# Patient Record
Sex: Male | Born: 1945 | Race: White | Hispanic: No | Marital: Single | Smoking: Never smoker
Health system: Southern US, Community
[De-identification: ages and names within clinical notes are randomized; demographics above are authoritative.]

## PROBLEM LIST (undated history)

## (undated) DIAGNOSIS — C801 Malignant (primary) neoplasm, unspecified: Secondary | ICD-10-CM

## (undated) DIAGNOSIS — E7800 Pure hypercholesterolemia, unspecified: Secondary | ICD-10-CM

## (undated) DIAGNOSIS — N189 Chronic kidney disease, unspecified: Secondary | ICD-10-CM

## (undated) DIAGNOSIS — I10 Essential (primary) hypertension: Secondary | ICD-10-CM

## (undated) DIAGNOSIS — M1990 Unspecified osteoarthritis, unspecified site: Secondary | ICD-10-CM

## (undated) DIAGNOSIS — Z905 Acquired absence of kidney: Secondary | ICD-10-CM

## (undated) HISTORY — DX: Chronic kidney disease, unspecified: N18.9

## (undated) HISTORY — DX: Malignant (primary) neoplasm, unspecified: C80.1

## (undated) HISTORY — PX: CORONARY ANGIOPLASTY WITH STENT PLACEMENT: SHX49

## (undated) HISTORY — PX: MOHS SURGERY: SHX181

## (undated) HISTORY — PX: APPENDECTOMY: SHX54

## (undated) HISTORY — DX: Unspecified osteoarthritis, unspecified site: M19.90

## (undated) HISTORY — DX: Acquired absence of kidney: Z90.5

## (undated) HISTORY — PX: NEPHRECTOMY: SHX65

---

## 2015-02-05 DIAGNOSIS — N400 Enlarged prostate without lower urinary tract symptoms: Secondary | ICD-10-CM | POA: Insufficient documentation

## 2016-09-29 DIAGNOSIS — E785 Hyperlipidemia, unspecified: Secondary | ICD-10-CM | POA: Insufficient documentation

## 2016-09-29 DIAGNOSIS — Z955 Presence of coronary angioplasty implant and graft: Secondary | ICD-10-CM | POA: Insufficient documentation

## 2016-09-29 DIAGNOSIS — I10 Essential (primary) hypertension: Secondary | ICD-10-CM | POA: Insufficient documentation

## 2017-04-16 DIAGNOSIS — Z85528 Personal history of other malignant neoplasm of kidney: Secondary | ICD-10-CM | POA: Insufficient documentation

## 2017-04-16 DIAGNOSIS — I2510 Atherosclerotic heart disease of native coronary artery without angina pectoris: Secondary | ICD-10-CM | POA: Insufficient documentation

## 2017-09-10 ENCOUNTER — Emergency Department (HOSPITAL_COMMUNITY)
Admission: EM | Admit: 2017-09-10 | Discharge: 2017-09-10 | Disposition: A | Discharge status: Home / Self Care | Payer: Medicare HMO | Attending: Emergency Medicine | Admitting: Emergency Medicine

## 2017-09-10 ENCOUNTER — Encounter (HOSPITAL_COMMUNITY): Payer: Self-pay | Admitting: Emergency Medicine

## 2017-09-10 DIAGNOSIS — B029 Zoster without complications: Secondary | ICD-10-CM

## 2017-09-10 DIAGNOSIS — I10 Essential (primary) hypertension: Secondary | ICD-10-CM | POA: Diagnosis not present

## 2017-09-10 DIAGNOSIS — Z79899 Other long term (current) drug therapy: Secondary | ICD-10-CM | POA: Insufficient documentation

## 2017-09-10 DIAGNOSIS — R21 Rash and other nonspecific skin eruption: Secondary | ICD-10-CM | POA: Diagnosis present

## 2017-09-10 HISTORY — DX: Essential (primary) hypertension: I10

## 2017-09-10 HISTORY — DX: Pure hypercholesterolemia, unspecified: E78.00

## 2017-09-10 MED ORDER — VALACYCLOVIR HCL 1 G PO TABS: 1000.0000 mg | ORAL_TABLET | Freq: Three times a day (TID) | ORAL | 0 refills | Status: AC

## 2017-09-10 NOTE — ED Triage Notes (Signed)
Pt c/o right rib pain.  Pt presents with redness & fluid filled vesicles to right back radiating to right rib area.  Pt stated "I saw my chiropractor and thought he had done something to me, so I went to sleep with the heating pad.  Look at how I burned myself."

## 2017-09-13 ENCOUNTER — Ambulatory Visit (HOSPITAL_COMMUNITY)
Admission: EM | Admit: 2017-09-13 | Discharge: 2017-09-13 | Disposition: A | Discharge status: Home / Self Care | Payer: Medicare HMO | Attending: Internal Medicine | Admitting: Internal Medicine

## 2017-09-13 ENCOUNTER — Encounter (HOSPITAL_COMMUNITY): Payer: Self-pay | Admitting: Emergency Medicine

## 2017-09-13 ENCOUNTER — Other Ambulatory Visit: Payer: Self-pay

## 2017-09-13 DIAGNOSIS — B029 Zoster without complications: Secondary | ICD-10-CM

## 2017-09-13 NOTE — ED Triage Notes (Signed)
States was Dx with shingles on Sunday at ER and is here to discuss contagious stages

## 2017-09-13 NOTE — Discharge Instructions (Signed)
Continue to take Valtrex.  He may keep area of rash covered with nonadherent gauze.  Please have good hand hygiene.  Please avoid exposure to anything that touches that area of your side.

## 2017-09-13 NOTE — ED Provider Notes (Signed)
Arthur Phillips    CSN: [id number] Arrival date & time: 09/13/17  1134     History   Chief Complaint Chief Complaint  Patient presents with  . Herpes Zoster    HPI Arthur Phillips is a 72 y.o. male history of hypertension and hyperlipidemia presenting today to discuss prevention of transmission of shingles.  Patient was seen in emergency room 2 days ago for a rash on his right side.  Patient was diagnosed with shingles, began on Valtrex.  Has been taking this for approximately 3 to 4 days.  Is concerned about spreading to his son who was recently in the hospital.  His son is 23 years old and is unsure if he had chickenpox as a child, but did not have the vaccine.  His son is not immunocompromised.  HPI  Past Medical History:  Diagnosis Date  . Hypercholesteremia   . Hypertension     There are no active problems to display for this patient.   Past Surgical History:  Procedure Laterality Date  . APPENDECTOMY    . CORONARY ANGIOPLASTY WITH STENT PLACEMENT    . MOHS SURGERY    . NEPHRECTOMY     right       Home Medications    Prior to Admission medications   Medication Sig Start Date End Date Taking? Authorizing Provider  atorvastatin (LIPITOR) 40 MG tablet 40 mg. 08/14/17   [provider]  metoprolol succinate (TOPROL-XL) 25 MG 24 hr tablet 25 mg. 08/14/17   [provider]  quinapril (ACCUPRIL) 10 MG tablet 10 mg. 08/14/17   [provider]  valACYclovir (VALTREX) 1000 MG tablet Take 1 tablet (1,000 mg total) by mouth 3 (three) times daily for 14 days. 09/10/17 09/24/17  Jola Schmidt, MD    Family History No family history on file.  Social History Social History   Tobacco Use  . Smoking status: Not on file  Substance Use Topics  . Alcohol use: Never    Frequency: Never  . Drug use: Never     Allergies   Patient has no known allergies.   Review of Systems Review of Systems  Constitutional: Negative for fatigue and fever.    Respiratory: Negative for shortness of breath.   Cardiovascular: Negative for chest pain.  Musculoskeletal: Positive for myalgias. Negative for arthralgias and neck pain.  Skin: Positive for color change and rash.  Neurological: Negative for dizziness, weakness, light-headedness, numbness and headaches.     Physical Exam Triage Vital Signs ED Triage Vitals  Enc Vitals Group     BP 09/13/17 1221 (!) 139/91     Pulse Rate 09/13/17 1221 79     Resp --      Temp 09/13/17 1221 (!) 97.3 F (36.3 C)     Temp Source 09/13/17 1221 Oral     SpO2 09/13/17 1221 95 %     Weight --      Height --      Head Circumference --      Peak Flow --      Pain Score 09/13/17 1218 0     Pain Loc --      Pain Edu? --      Excl. in [place]? --    No data found.  Updated Vital Signs BP (!) 139/91 (BP Location: Left Arm)   Pulse 79   Temp (!) 97.3 F (36.3 C) (Oral)   SpO2 95%   Visual Acuity Right Eye Distance:   Left  Eye Distance:   Bilateral Distance:    Right Eye Near:   Left Eye Near:    Bilateral Near:     Physical Exam  Constitutional: He appears well-developed and well-nourished.  HENT:  Head: Normocephalic and atraumatic.  Eyes: Conjunctivae are normal.  Neck: Neck supple.  Cardiovascular: Normal rate.  Pulmonary/Chest: Effort normal. No respiratory distress.  Musculoskeletal: He exhibits no edema.  Neurological: He is alert.  Skin: Skin is warm and dry. Rash noted.  Dermatomal rash to right\side and abdomen, multiple lesions still vesicular and have not began crusting.  Psychiatric: He has a normal mood and affect.  Nursing note and vitals reviewed.    UC Treatments / Results  Labs (all labs ordered are listed, but only abnormal results are displayed) Labs Reviewed - No data to display  EKG None Radiology No results found.  Procedures Procedures (including critical care time)  Medications Ordered in UC Medications - No data to display   Initial Impression /  Assessment and Plan / UC Course  I have reviewed the triage vital signs and the nursing notes.  Pertinent labs & imaging results that were available during my care of the patient were reviewed by me and considered in my medical decision making (see chart for details).     Discussed with patient about covering area to prevent transmission as it spread to contact.  Discussed separating laundry and having good hand hygiene.  It appears that his rash is still in a fascicular stage and likely still contagious.  Advised that until this is crusted begins healing that he will still be contagious. Discussed strict return precautions. Patient verbalized understanding and is agreeable with plan.   Final Clinical Impressions(s) / UC Diagnoses   Final diagnoses:  Herpes zoster without complication    ED Discharge Orders    None       Controlled Substance Prescriptions [place] Controlled Substance Registry consulted? Not Applicable   Arthur Phillips, [name], [name] 09/13/17 1329

## 2017-09-18 NOTE — ED Provider Notes (Signed)
[facility] DEPT Provider Note   CSN: [id number] Arrival date & time: 09/10/17  0148     History   Chief Complaint Chief Complaint  Patient presents with  . Rash    HPI Arthur Phillips is a 72 y.o. male.  HPI Patient is a 72 year old male who reports 3 days of right-sided rib discomfort.  He went saw his chiropractor for back pain and lateral chest pain and noticed a rash this evening.  He has a rash consistent with herpes zoster involving the right T7-T8 dermatome region.  No fevers or chills.  No redness.  No history of shingles before.  He has not received his shingles vaccine.  No other complaints at this time.   Past Medical History:  Diagnosis Date  . Hypercholesteremia   . Hypertension     There are no active problems to display for this patient.   Past Surgical History:  Procedure Laterality Date  . APPENDECTOMY    . CORONARY ANGIOPLASTY WITH STENT PLACEMENT    . MOHS SURGERY    . NEPHRECTOMY     right        Home Medications    Prior to Admission medications   Medication Sig Start Date End Date Taking? Authorizing Provider  atorvastatin (LIPITOR) 40 MG tablet 40 mg. 08/14/17   [provider]  metoprolol succinate (TOPROL-XL) 25 MG 24 hr tablet 25 mg. 08/14/17   [provider]  quinapril (ACCUPRIL) 10 MG tablet 10 mg. 08/14/17   [provider]  valACYclovir (VALTREX) 1000 MG tablet Take 1 tablet (1,000 mg total) by mouth 3 (three) times daily for 14 days. 09/10/17 09/24/17  Jola Schmidt, MD    Family History No family history on file.  Social History Social History   Tobacco Use  . Smoking status: Not on file  Substance Use Topics  . Alcohol use: Never    Frequency: Never  . Drug use: Never     Allergies   Patient has no known allergies.   Review of Systems Review of Systems  All other systems reviewed and are negative.    Physical Exam Updated Vital Signs BP (!) 158/93 (BP  Location: Right Arm)   Pulse 89   Temp (!) 97.4 F (36.3 C) (Oral)   Resp 20   Ht 5\' 10"  (1.778 m)   Wt 90.7 kg (200 lb)   SpO2 95%   BMI 28.70 kg/m   Physical Exam  Constitutional: He is oriented to person, place, and time. He appears well-developed and well-nourished.  HENT:  Head: Normocephalic.  Eyes: EOM are normal.  Neck: Normal range of motion.  Pulmonary/Chest: Effort normal.  Abdominal: He exhibits no distension.  Musculoskeletal: Normal range of motion.  Neurological: He is alert and oriented to person, place, and time.  Skin:  Right-sided rash consistent with herpes zoster involving the right T7-T8 dermatomes.  No surrounding signs of infection  Psychiatric: He has a normal mood and affect.  Nursing note and vitals reviewed.    ED Treatments / Results  Labs (all labs ordered are listed, but only abnormal results are displayed) Labs Reviewed - No data to display  EKG None  Radiology No results found.  Procedures Procedures (including critical care time)  Medications Ordered in ED Medications - No data to display   Initial Impression / Assessment and Plan / ED Course  I have reviewed the triage vital signs and the nursing notes.  Pertinent labs & imaging results  that were available during my care of the patient were reviewed by me and considered in my medical decision making (see chart for details).     Herpes zoster.  DC home with antiviral medication.  Primary care follow-up.  No signs of secondary infection at this time  Final Clinical Impressions(s) / ED Diagnoses   Final diagnoses:  Herpes zoster without complication    ED Discharge Orders        Ordered    valACYclovir (VALTREX) 1000 MG tablet  3 times daily     09/10/17 0301       Jola Schmidt, MD 09/18/17 [phone]

## 2017-09-28 DIAGNOSIS — M9903 Segmental and somatic dysfunction of lumbar region: Secondary | ICD-10-CM | POA: Diagnosis not present

## 2017-09-28 DIAGNOSIS — M9905 Segmental and somatic dysfunction of pelvic region: Secondary | ICD-10-CM | POA: Diagnosis not present

## 2017-09-28 DIAGNOSIS — M9902 Segmental and somatic dysfunction of thoracic region: Secondary | ICD-10-CM | POA: Diagnosis not present

## 2017-09-28 DIAGNOSIS — M4604 Spinal enthesopathy, thoracic region: Secondary | ICD-10-CM | POA: Diagnosis not present

## 2017-10-01 DIAGNOSIS — M9905 Segmental and somatic dysfunction of pelvic region: Secondary | ICD-10-CM | POA: Diagnosis not present

## 2017-10-01 DIAGNOSIS — M4604 Spinal enthesopathy, thoracic region: Secondary | ICD-10-CM | POA: Diagnosis not present

## 2017-10-01 DIAGNOSIS — M9903 Segmental and somatic dysfunction of lumbar region: Secondary | ICD-10-CM | POA: Diagnosis not present

## 2017-10-01 DIAGNOSIS — M9902 Segmental and somatic dysfunction of thoracic region: Secondary | ICD-10-CM | POA: Diagnosis not present

## 2017-10-04 DIAGNOSIS — M9902 Segmental and somatic dysfunction of thoracic region: Secondary | ICD-10-CM | POA: Diagnosis not present

## 2017-10-04 DIAGNOSIS — M4604 Spinal enthesopathy, thoracic region: Secondary | ICD-10-CM | POA: Diagnosis not present

## 2017-10-04 DIAGNOSIS — M9903 Segmental and somatic dysfunction of lumbar region: Secondary | ICD-10-CM | POA: Diagnosis not present

## 2017-10-04 DIAGNOSIS — M9905 Segmental and somatic dysfunction of pelvic region: Secondary | ICD-10-CM | POA: Diagnosis not present

## 2017-10-08 DIAGNOSIS — M4604 Spinal enthesopathy, thoracic region: Secondary | ICD-10-CM | POA: Diagnosis not present

## 2017-10-08 DIAGNOSIS — M9905 Segmental and somatic dysfunction of pelvic region: Secondary | ICD-10-CM | POA: Diagnosis not present

## 2017-10-08 DIAGNOSIS — M9902 Segmental and somatic dysfunction of thoracic region: Secondary | ICD-10-CM | POA: Diagnosis not present

## 2017-10-08 DIAGNOSIS — M9903 Segmental and somatic dysfunction of lumbar region: Secondary | ICD-10-CM | POA: Diagnosis not present

## 2017-10-18 DIAGNOSIS — M9902 Segmental and somatic dysfunction of thoracic region: Secondary | ICD-10-CM | POA: Diagnosis not present

## 2017-10-18 DIAGNOSIS — M9903 Segmental and somatic dysfunction of lumbar region: Secondary | ICD-10-CM | POA: Diagnosis not present

## 2017-10-18 DIAGNOSIS — M9905 Segmental and somatic dysfunction of pelvic region: Secondary | ICD-10-CM | POA: Diagnosis not present

## 2017-10-18 DIAGNOSIS — M4604 Spinal enthesopathy, thoracic region: Secondary | ICD-10-CM | POA: Diagnosis not present

## 2017-10-25 DIAGNOSIS — M9905 Segmental and somatic dysfunction of pelvic region: Secondary | ICD-10-CM | POA: Diagnosis not present

## 2017-10-25 DIAGNOSIS — M4604 Spinal enthesopathy, thoracic region: Secondary | ICD-10-CM | POA: Diagnosis not present

## 2017-10-25 DIAGNOSIS — M9902 Segmental and somatic dysfunction of thoracic region: Secondary | ICD-10-CM | POA: Diagnosis not present

## 2017-10-25 DIAGNOSIS — M9903 Segmental and somatic dysfunction of lumbar region: Secondary | ICD-10-CM | POA: Diagnosis not present

## 2017-11-01 DIAGNOSIS — M9905 Segmental and somatic dysfunction of pelvic region: Secondary | ICD-10-CM | POA: Diagnosis not present

## 2017-11-01 DIAGNOSIS — M9902 Segmental and somatic dysfunction of thoracic region: Secondary | ICD-10-CM | POA: Diagnosis not present

## 2017-11-01 DIAGNOSIS — M9903 Segmental and somatic dysfunction of lumbar region: Secondary | ICD-10-CM | POA: Diagnosis not present

## 2017-11-01 DIAGNOSIS — M4604 Spinal enthesopathy, thoracic region: Secondary | ICD-10-CM | POA: Diagnosis not present

## 2017-11-06 DIAGNOSIS — I10 Essential (primary) hypertension: Secondary | ICD-10-CM | POA: Diagnosis not present

## 2017-11-06 DIAGNOSIS — Z955 Presence of coronary angioplasty implant and graft: Secondary | ICD-10-CM | POA: Diagnosis not present

## 2017-11-06 DIAGNOSIS — I2510 Atherosclerotic heart disease of native coronary artery without angina pectoris: Secondary | ICD-10-CM | POA: Diagnosis not present

## 2017-11-06 DIAGNOSIS — E785 Hyperlipidemia, unspecified: Secondary | ICD-10-CM | POA: Diagnosis not present

## 2017-11-08 DIAGNOSIS — M9902 Segmental and somatic dysfunction of thoracic region: Secondary | ICD-10-CM | POA: Diagnosis not present

## 2017-11-08 DIAGNOSIS — M9905 Segmental and somatic dysfunction of pelvic region: Secondary | ICD-10-CM | POA: Diagnosis not present

## 2017-11-08 DIAGNOSIS — M4604 Spinal enthesopathy, thoracic region: Secondary | ICD-10-CM | POA: Diagnosis not present

## 2017-11-08 DIAGNOSIS — M9903 Segmental and somatic dysfunction of lumbar region: Secondary | ICD-10-CM | POA: Diagnosis not present

## 2017-11-15 DIAGNOSIS — M9902 Segmental and somatic dysfunction of thoracic region: Secondary | ICD-10-CM | POA: Diagnosis not present

## 2017-11-15 DIAGNOSIS — M9905 Segmental and somatic dysfunction of pelvic region: Secondary | ICD-10-CM | POA: Diagnosis not present

## 2017-11-15 DIAGNOSIS — M4604 Spinal enthesopathy, thoracic region: Secondary | ICD-10-CM | POA: Diagnosis not present

## 2017-11-15 DIAGNOSIS — M9903 Segmental and somatic dysfunction of lumbar region: Secondary | ICD-10-CM | POA: Diagnosis not present

## 2018-04-25 DIAGNOSIS — L905 Scar conditions and fibrosis of skin: Secondary | ICD-10-CM | POA: Diagnosis not present

## 2018-04-25 DIAGNOSIS — D225 Melanocytic nevi of trunk: Secondary | ICD-10-CM | POA: Diagnosis not present

## 2018-04-25 DIAGNOSIS — D1801 Hemangioma of skin and subcutaneous tissue: Secondary | ICD-10-CM | POA: Diagnosis not present

## 2018-04-25 DIAGNOSIS — D239 Other benign neoplasm of skin, unspecified: Secondary | ICD-10-CM | POA: Diagnosis not present

## 2018-04-25 DIAGNOSIS — L821 Other seborrheic keratosis: Secondary | ICD-10-CM | POA: Diagnosis not present

## 2018-05-03 DIAGNOSIS — R351 Nocturia: Secondary | ICD-10-CM | POA: Diagnosis not present

## 2018-05-03 DIAGNOSIS — N401 Enlarged prostate with lower urinary tract symptoms: Secondary | ICD-10-CM | POA: Diagnosis not present

## 2018-07-04 ENCOUNTER — Encounter: Payer: Self-pay | Admitting: Gastroenterology

## 2018-07-18 ENCOUNTER — Ambulatory Visit (AMBULATORY_SURGERY_CENTER): Payer: Self-pay

## 2018-07-18 ENCOUNTER — Encounter: Payer: Self-pay | Admitting: Gastroenterology

## 2018-07-18 VITALS — Ht 70.0 in | Wt 207.8 lb

## 2018-07-18 DIAGNOSIS — R195 Other fecal abnormalities: Secondary | ICD-10-CM

## 2018-07-18 MED ORDER — PEG 3350-KCL-NA BICARB-NACL 420 G PO SOLR: 4000.0000 mL | Freq: Once | ORAL | 0 refills | Status: AC

## 2018-07-18 NOTE — Progress Notes (Signed)
Denies allergies to eggs or soy products. Denies complication of anesthesia or sedation. Denies use of weight loss medication. Denies use of O2.   Emmi instructions declined.  

## 2018-08-01 ENCOUNTER — Encounter: Payer: Medicare HMO | Admitting: Gastroenterology

## 2018-10-22 DIAGNOSIS — M1712 Unilateral primary osteoarthritis, left knee: Secondary | ICD-10-CM | POA: Insufficient documentation

## 2019-04-25 ENCOUNTER — Ambulatory Visit: Payer: Medicare Other | Admitting: Podiatry

## 2019-04-25 ENCOUNTER — Ambulatory Visit: Payer: Medicare Other

## 2019-04-25 ENCOUNTER — Other Ambulatory Visit: Payer: Self-pay

## 2019-04-25 ENCOUNTER — Encounter: Payer: Self-pay | Admitting: Podiatry

## 2019-04-25 DIAGNOSIS — G8929 Other chronic pain: Secondary | ICD-10-CM | POA: Diagnosis not present

## 2019-04-25 DIAGNOSIS — M79675 Pain in left toe(s): Secondary | ICD-10-CM

## 2019-04-25 DIAGNOSIS — L600 Ingrowing nail: Secondary | ICD-10-CM | POA: Diagnosis not present

## 2019-04-25 MED ORDER — CEPHALEXIN 500 MG PO CAPS: 500.0000 mg | ORAL_CAPSULE | Freq: Three times a day (TID) | ORAL | 0 refills | Status: AC

## 2019-04-25 MED ORDER — CEPHALEXIN 500 MG PO CAPS: 500.0000 mg | ORAL_CAPSULE | Freq: Three times a day (TID) | ORAL | 0 refills | Status: DC

## 2019-04-25 NOTE — Addendum Note (Signed)
Addended by: Cranford Mon R on: 04/25/2019 05:39 PM   Modules accepted: Orders

## 2019-04-25 NOTE — Patient Instructions (Signed)

## 2019-04-25 NOTE — Progress Notes (Signed)
Subjective:   Patient ID: Arthur Phillips, male   DOB: 73 y.o.   MRN: [id number]   HPI 73 year old male presents the office today for concerns of ingrown toenails with the left lateral hallux the most symptomatic.  The other nails he gets ingrown but not causing any pain.  He states he was seen at an urgent care about 3.5 weeks ago and the nail was trimmed somewhat however he states that it did not help significantly he is continued have quite a bit of pain to the nail border particularly pressure.  He denies any drainage or pus but is sensitive to touch.  He also had a previous injury to the right hallux toenail from shoes and had blood underneath the nail however this did grow out.  No pain on the right side.   Review of Systems  All other systems reviewed and are negative.  Past Medical History:  Diagnosis Date  . Arthritis   . Cancer [facility])    Skin cancer  . Chronic kidney disease   . H/O right radical nephrectomy   . Hypercholesteremia   . Hypertension     Past Surgical History:  Procedure Laterality Date  . APPENDECTOMY    . CORONARY ANGIOPLASTY WITH STENT PLACEMENT    . MOHS SURGERY    . NEPHRECTOMY     right     Current Outpatient Medications:  .  metoprolol succinate (TOPROL-XL) 25 MG 24 hr tablet, , Disp: , Rfl:  .  aspirin EC 81 MG tablet, Take 81 mg by mouth daily., Disp: , Rfl:  .  atorvastatin (LIPITOR) 40 MG tablet, 40 mg., Disp: , Rfl:  .  cephALEXin (KEFLEX) 500 MG capsule, Take 1 capsule (500 mg total) by mouth 3 (three) times daily., Disp: 21 capsule, Rfl: 0 .  Lutein 40 MG CAPS, Take by mouth., Disp: , Rfl:  .  Multiple Vitamin (MULTIVITAMIN) tablet, Take 1 tablet by mouth daily., Disp: , Rfl:  .  OVER THE COUNTER MEDICATION, Calcium, Magnesium and Zinc one tablet daily., Disp: , Rfl:  .  OVER THE COUNTER MEDICATION, Co Q 10 100 mg. One capsule daily., Disp: , Rfl:  .  OVER THE COUNTER MEDICATION, Hyaluronic Acid One PO daily., Disp: , Rfl:  .  OVER THE COUNTER  MEDICATION, L-Caritine 500 mg. Take one capsule daily., Disp: , Rfl:  .  OVER THE COUNTER MEDICATION, Lutein 40 mg. One capsule daily., Disp: , Rfl:  .  OVER THE COUNTER MEDICATION, Milk Thistle, 700 mg. One tablet by mouth daily., Disp: , Rfl:  .  OVER THE COUNTER MEDICATION, [place] and Rhubarb 500 mg. One daily., Disp: , Rfl:  .  OVER THE COUNTER MEDICATION, Saw Palmetto 450 mg. One capsule 2 times daily., Disp: , Rfl:  .  OVER THE COUNTER MEDICATION, Tumeric 720 mg. One tablet daily., Disp: , Rfl:  .  OVER THE COUNTER MEDICATION, Vitamin D 3 50 MCG one capsule daily., Disp: , Rfl:  .  OVER THE COUNTER MEDICATION, Yucca Extract 500 mg two times daily., Disp: , Rfl:  .  quinapril (ACCUPRIL) 10 MG tablet, 10 mg., Disp: , Rfl:   No Known Allergies       Objective:  Physical Exam  General: AAO x3, NAD  Dermatological: Incurvation present along the lateral aspect the left hallux toenail with localized edema and erythema.  The erythema is likely more from inflammation as opposed to infection.  There is no drainage or pus.  No ascending cellulitis.  There is no open sores otherwise.  Vascular: Dorsalis Pedis artery and Posterior Tibial artery pedal pulses are 2/4 bilateral with immedate capillary fill time.  No varicosities and no lower extremity edema present bilateral. There is no pain with calf compression, swelling, warmth, erythema.   Neruologic: Grossly intact via light touch bilateral.  Musculoskeletal: No gross boney pedal deformities bilateral. No pain, crepitus, or limitation noted with foot and ankle range of motion bilateral. Muscular strength 5/5 in all groups tested bilateral.  Gait: Unassisted, Nonantalgic.       Assessment:   Ingrown toenail left lateral hallux     Plan:  -Treatment options discussed including all alternatives, risks, and complications -Etiology of symptoms were discussed -At this time, the patient is requesting partial nail removal with chemical  matricectomy to the symptomatic portion of the nail. Risks and complications were discussed with the patient for which they understand and written consent was obtained. Under sterile conditions a total of 3 mL of a mixture of 2% lidocaine plain and 0.5% Marcaine plain was infiltrated in a hallux block fashion. Once anesthetized, the skin was prepped in sterile fashion. A tourniquet was then applied. Next the lateral aspect of hallux nail border was then sharply excised making sure to remove the entire offending nail border. Once the nails were ensured to be removed area was debrided and the underlying skin was intact. There is no purulence identified in the procedure. Next phenol was then applied under standard conditions and copiously irrigated. Silvadene was applied. A dry sterile dressing was applied. After application of the dressing the tourniquet was removed and there is found to be an immediate capillary refill time to the digit. The patient tolerated the procedure well any complications. Post procedure instructions were discussed the patient for which he verbally understood. Follow-up in 2 weeks for nail check or sooner if any problems are to arise. Discussed signs/symptoms of infection and directed to call the office immediately should any occur or go directly to the emergency room. In the meantime, encouraged to call the office with any questions, concerns, changes symptoms. -keflex  Return in about 2 weeks (around 05/09/2019) for nail check .  Trula Slade DPM

## 2019-05-08 ENCOUNTER — Ambulatory Visit: Payer: Medicare Other | Admitting: Podiatry

## 2020-01-20 ENCOUNTER — Encounter (HOSPITAL_COMMUNITY): Payer: Self-pay

## 2020-01-20 ENCOUNTER — Other Ambulatory Visit: Payer: Self-pay

## 2020-01-20 ENCOUNTER — Emergency Department (HOSPITAL_COMMUNITY)
Admission: EM | Admit: 2020-01-20 | Discharge: 2020-01-20 | Disposition: A | Discharge status: Home / Self Care | Payer: Medicare Other | Attending: Emergency Medicine | Admitting: Emergency Medicine

## 2020-01-20 DIAGNOSIS — R197 Diarrhea, unspecified: Secondary | ICD-10-CM | POA: Diagnosis not present

## 2020-01-20 DIAGNOSIS — R5383 Other fatigue: Secondary | ICD-10-CM | POA: Insufficient documentation

## 2020-01-20 DIAGNOSIS — Z5321 Procedure and treatment not carried out due to patient leaving prior to being seen by health care provider: Secondary | ICD-10-CM | POA: Insufficient documentation

## 2020-01-20 DIAGNOSIS — R55 Syncope and collapse: Secondary | ICD-10-CM | POA: Insufficient documentation

## 2020-01-20 LAB — CBC
HCT: 35.4 % — ABNORMAL LOW (ref 39.0–52.0)
Hemoglobin: 11.7 g/dL — ABNORMAL LOW (ref 13.0–17.0)
MCH: 29.5 pg (ref 26.0–34.0)
MCHC: 33.1 g/dL (ref 30.0–36.0)
MCV: 89.4 fL (ref 80.0–100.0)
Platelets: 159 10*3/uL (ref 150–400)
RBC: 3.96 MIL/uL — ABNORMAL LOW (ref 4.22–5.81)
RDW: 13.8 % (ref 11.5–15.5)
WBC: 4.9 10*3/uL (ref 4.0–10.5)
nRBC: 0 % (ref 0.0–0.2)

## 2020-01-20 LAB — BASIC METABOLIC PANEL
Anion gap: 10 (ref 5–15)
BUN: 22 mg/dL (ref 8–23)
CO2: 22 mmol/L (ref 22–32)
Calcium: 7.8 mg/dL — ABNORMAL LOW (ref 8.9–10.3)
Chloride: 102 mmol/L (ref 98–111)
Creatinine, Ser: 1.36 mg/dL — ABNORMAL HIGH (ref 0.61–1.24)
GFR calc Af Amer: 59 mL/min — ABNORMAL LOW (ref >60)
GFR calc non Af Amer: 51 mL/min — ABNORMAL LOW (ref >60)
Glucose, Bld: 125 mg/dL — ABNORMAL HIGH (ref 70–99)
Potassium: 3.7 mmol/L (ref 3.5–5.1)
Sodium: 134 mmol/L — ABNORMAL LOW (ref 135–145)

## 2020-01-20 LAB — CBG MONITORING, ED: Glucose-Capillary: 125 mg/dL — ABNORMAL HIGH (ref 70–99)

## 2020-01-20 NOTE — ED Triage Notes (Signed)
Patient c/o fatigue, diarrhea. and near syncope x 3 days. Patient states that he recently took a vacation where he was in 3 different states.

## 2021-03-16 ENCOUNTER — Ambulatory Visit
Admission: RE | Admit: 2021-03-16 | Discharge: 2021-03-16 | Disposition: A | Discharge status: Home / Self Care | Payer: Medicare Other | Source: Ambulatory Visit | Attending: Chiropractic Medicine | Admitting: Chiropractic Medicine

## 2021-03-16 ENCOUNTER — Other Ambulatory Visit: Payer: Self-pay | Admitting: Chiropractic Medicine

## 2021-03-16 ENCOUNTER — Other Ambulatory Visit: Payer: Self-pay

## 2021-03-16 DIAGNOSIS — R52 Pain, unspecified: Secondary | ICD-10-CM

## 2023-02-19 IMAGING — CR DG LUMBAR SPINE 2-3V
3 series · 3 of 3 positions shown · non-contrast
Comparison: None.

CLINICAL DATA: Left-sided back and posterior ribcage pain for 1
week. Lifting injury.

EXAM:
LUMBAR SPINE - 2-3 VIEW

[w lumbar spine ap]
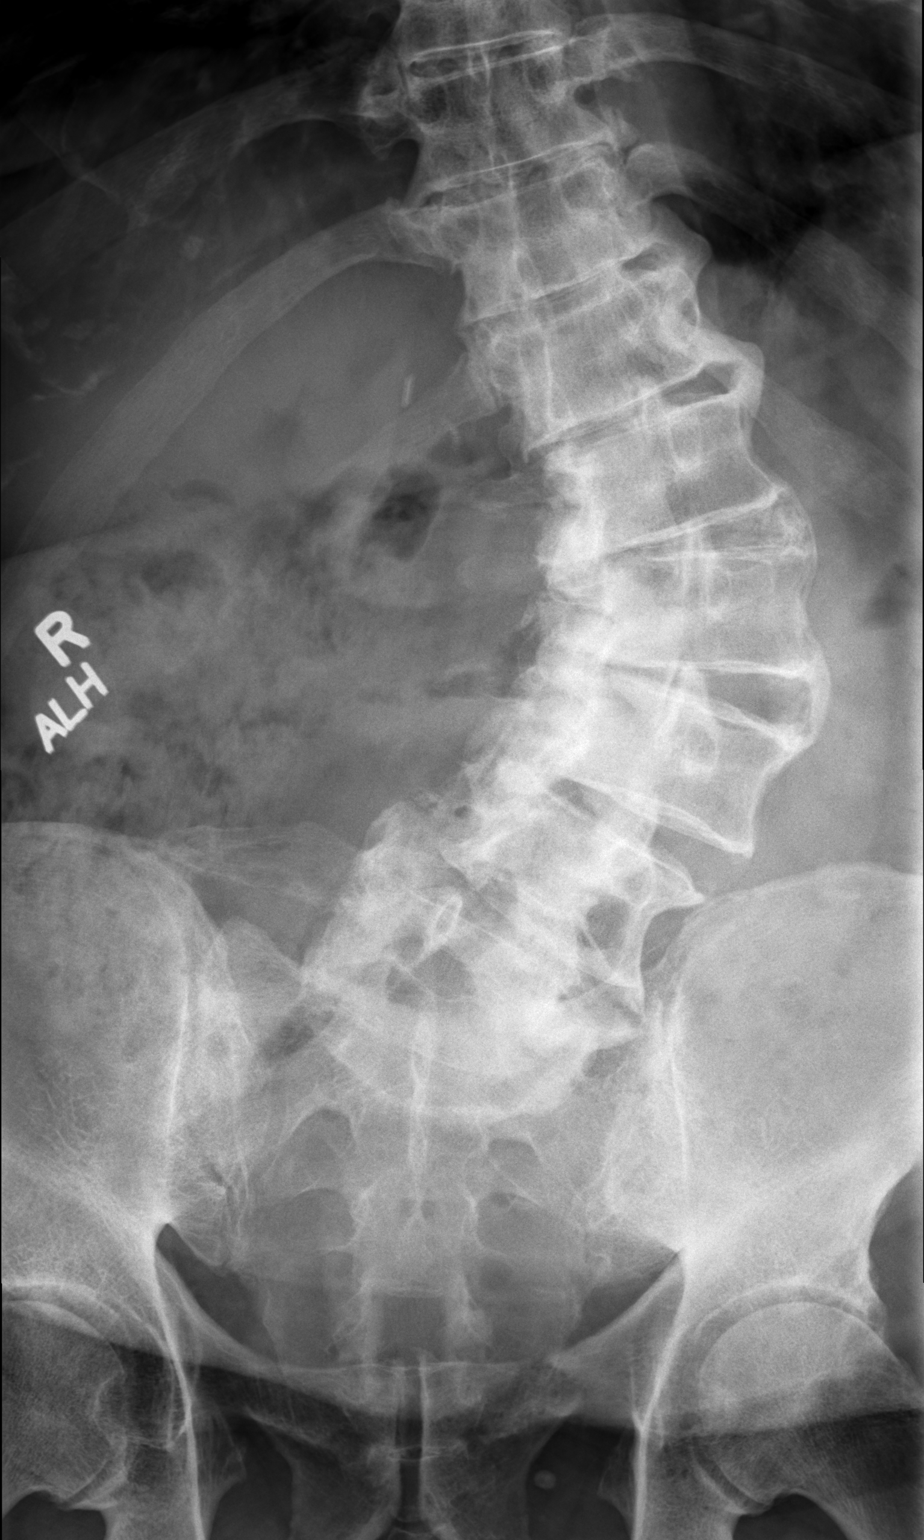

[w lumbar spine lat]
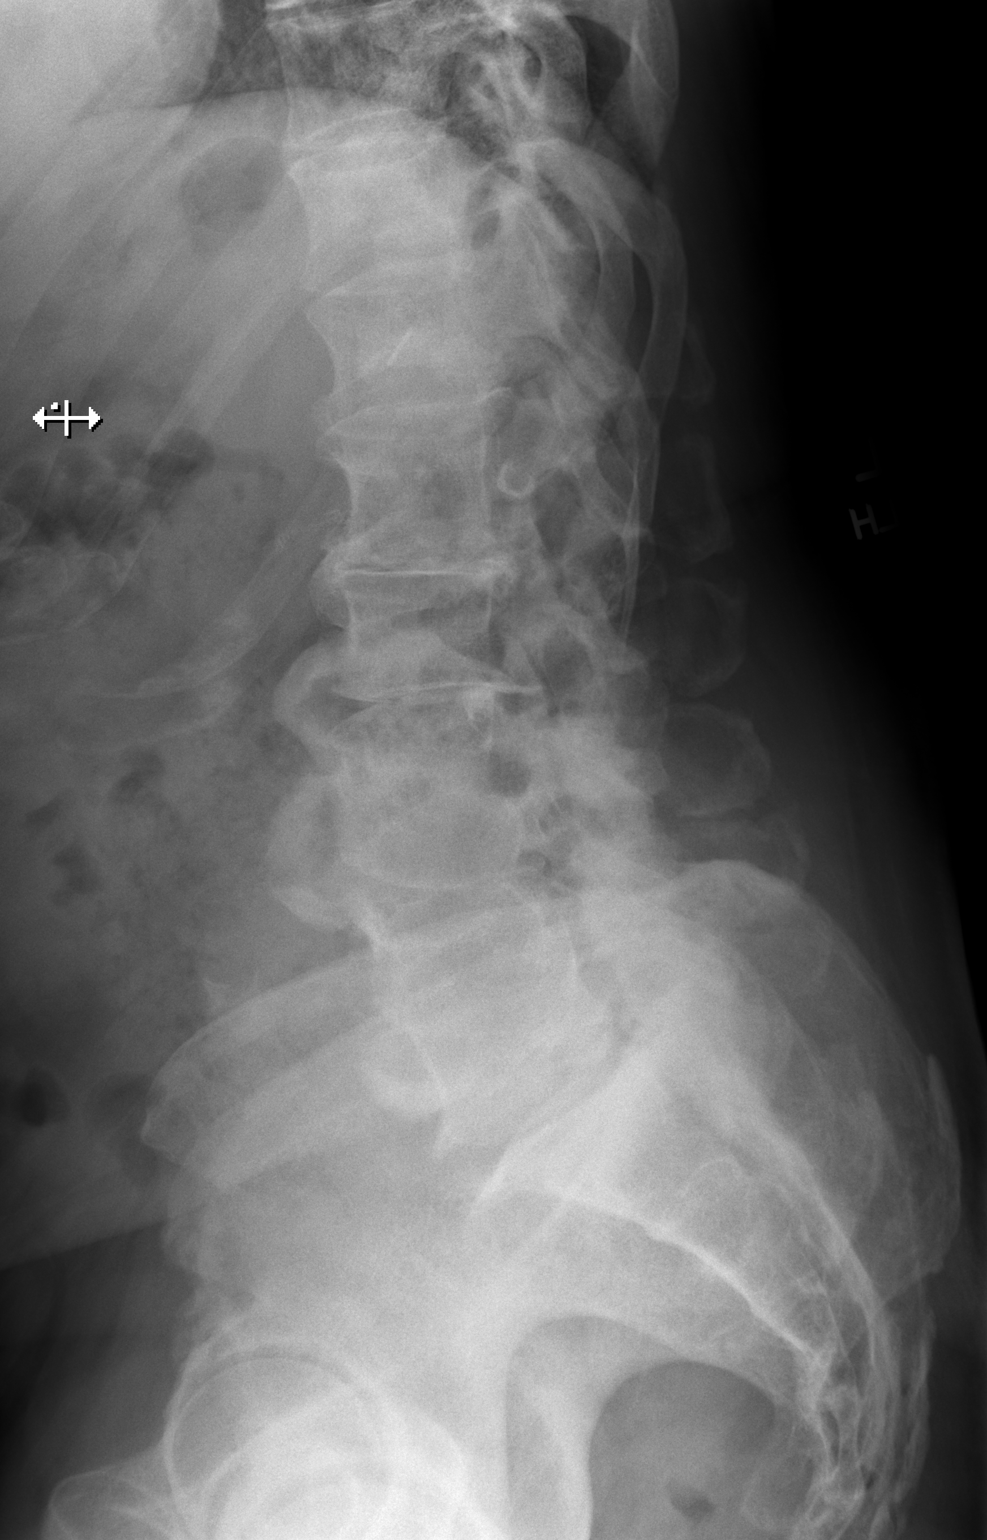

[w lumbar l-5 s-1 spot]
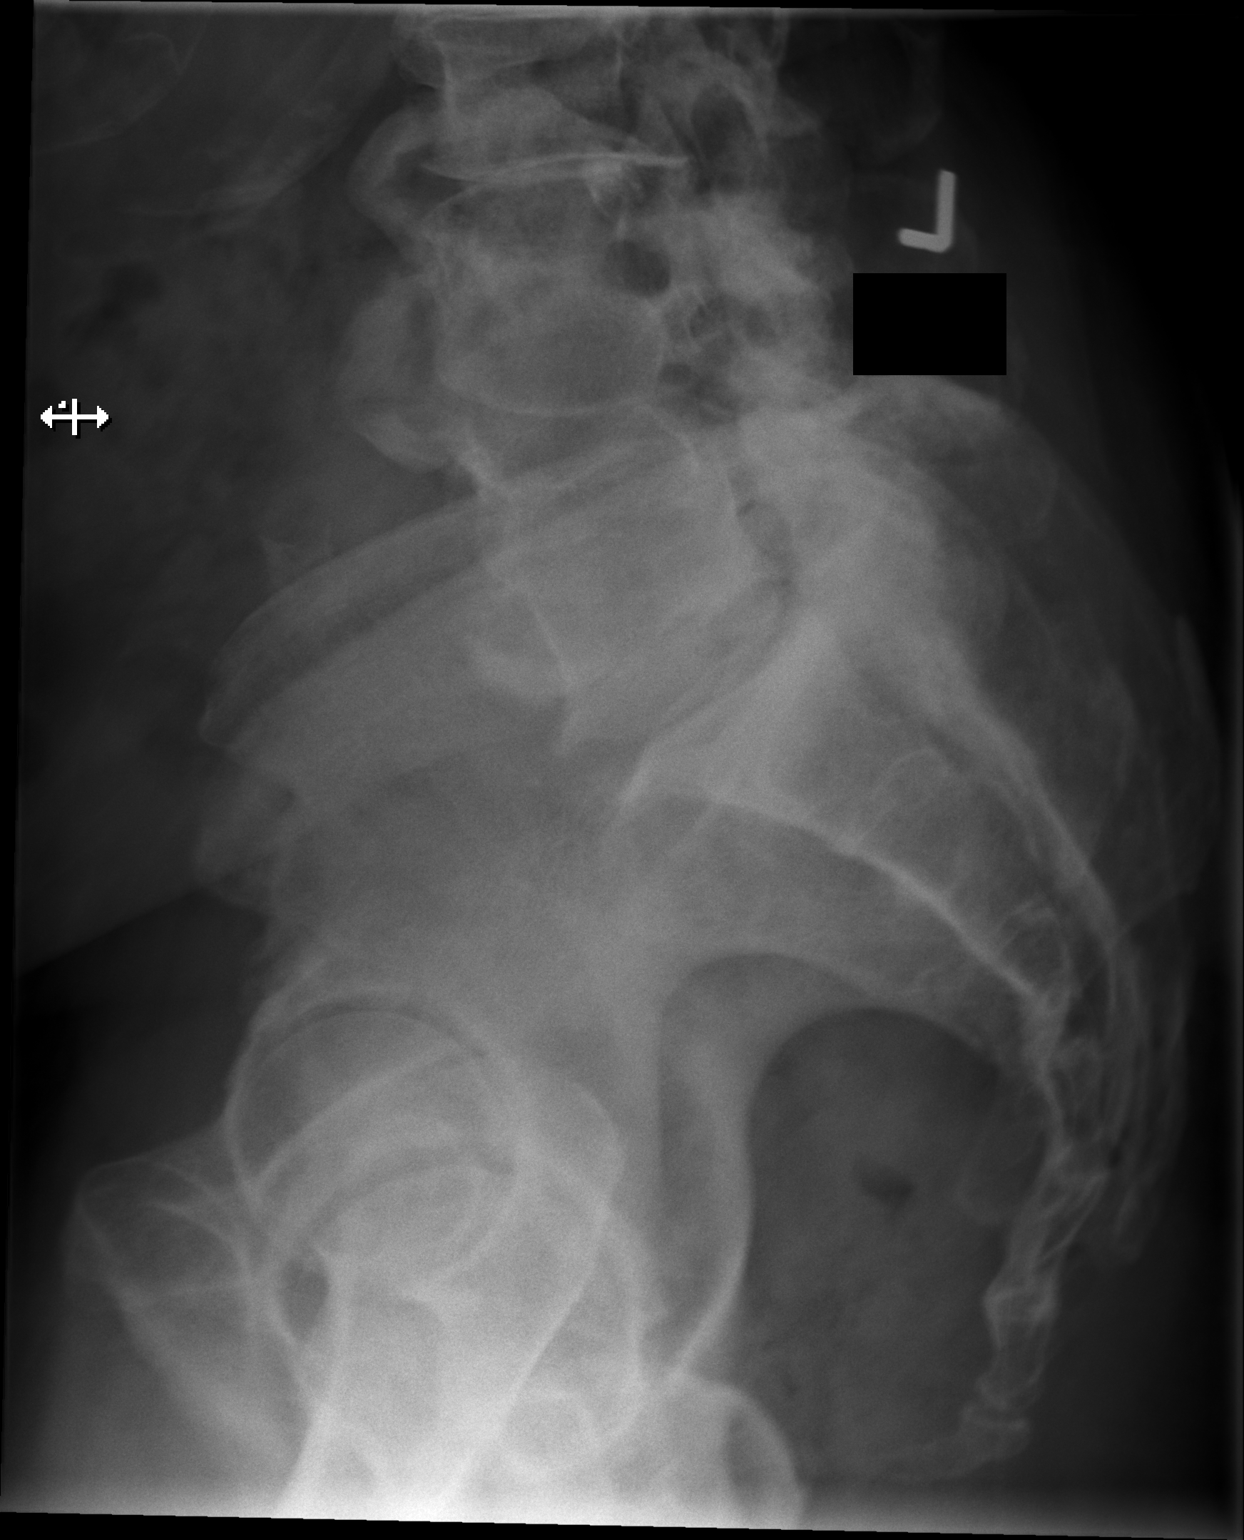

[3 of 3 positions shown; findings below may reference images not displayed]

FINDINGS: There are 5 lumbar type vertebral bodies. There is a severe convex
left scoliosis centered at L2, measuring approximately 49 degrees.
The lateral alignment is near anatomic. There is no evidence of
acute fracture or paraspinal hematoma. There is multilevel
spondylosis with disc space narrowing, endplate osteophytes and
facet hypertrophy. The sacroiliac joints appear unremarkable.
IMPRESSION: Multilevel spondylosis associated with a convex left lumbar
scoliosis. No acute osseous findings.
